# Patient Record
Sex: Male | Born: 2000 | Race: White | Hispanic: No | Marital: Single | Smoking: Never smoker
Health system: Southern US, Community
[De-identification: ages and names within clinical notes are randomized; demographics above are authoritative.]

---
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
## 2022-05-06 ENCOUNTER — Ambulatory Visit
Admission: EM | Admit: 2022-05-06 | Discharge: 2022-05-06 | Disposition: A | Discharge status: Home / Self Care | Payer: Commercial Managed Care - PPO

## 2022-05-06 ENCOUNTER — Encounter: Payer: Self-pay | Admitting: Emergency Medicine

## 2022-05-06 DIAGNOSIS — S0502XA Injury of conjunctiva and corneal abrasion without foreign body, left eye, initial encounter: Secondary | ICD-10-CM

## 2022-05-06 MED ORDER — TOBRAMYCIN-DEXAMETHASONE 0.3-0.1 % OP SUSP: 2.0000 [drp] | OPHTHALMIC | 0 refills | Status: AC

## 2022-05-06 NOTE — ED Triage Notes (Signed)
Something flew in left eye while doing yard work.  States vision is blurry and hurts when eye is closed ?

## 2022-05-06 NOTE — ED Provider Notes (Signed)
?[facility] ? ? ? ?CSN: [id number] ?Arrival date & time: 05/06/22  1233 ? ? ?  ? ?History   ?Chief Complaint ?No chief complaint on file. ? ? ?HPI ?Luke Underwood is a 21 y.o. male.  ? ?The patient is a 21 year old male who presents with left eye pain.  Patient states he was at work this morning when something flew into his left eye.  He thinks it was a piece of wood.  Since that time he has had blurred vision, and pain when he closes his eye.  He also states that he had tearing and drainage when the incident occurred.  Currently, he denies fever, chills, change in vision, loss of vision, drainage, or headache.  States that he has not been seen elsewhere for his injury. ? ?The history is provided by the patient.  ? ?History reviewed. No pertinent past medical history. ? ?There are no problems to display for this patient. ? ? ?History reviewed. No pertinent surgical history. ? ? ? ? ?Home Medications   ? ?Prior to Admission medications   ?Medication Sig Start Date End Date Taking? Authorizing Provider  ?escitalopram (LEXAPRO) 5 MG tablet Take 5 mg by mouth daily.   Yes [provider]  ?tobramycin-dexamethasone Wallene Dales) ophthalmic solution Place 2 drops into the left eye every 4 (four) hours while awake for 7 days. 05/06/22 05/13/22 Yes Cellie Dardis-Warren, Sadie Haber, NP  ?ibuprofen (ADVIL,MOTRIN) 400 MG tablet Take 1 tablet (400 mg total) by mouth every 6 (six) hours as needed for headache. 10/02/14   Mittie Bodo, MD  ? ? ?Family History ?History reviewed. No pertinent family history. ? ?Social History ?Social History  ? ?Tobacco Use  ? Smoking status: Never  ? Smokeless tobacco: Never  ?Vaping Use  ? Vaping Use: Never used  ? ? ? ?Allergies   ?Patient has no known allergies. ? ? ?Review of Systems ?Review of Systems  ?Constitutional: Negative.   ?Eyes:  Positive for pain, discharge and redness. Negative for photophobia and itching.  ?     Blurred vision  ?Respiratory: Negative.     ?Cardiovascular: Negative.   ?Skin: Negative.   ?Psychiatric/Behavioral: Negative.    ? ? ?Physical Exam ?Triage Vital Signs ?ED Triage Vitals  ?Enc Vitals Group  ?   BP 05/06/22 1317 (!) 146/76  ?   Pulse Rate 05/06/22 1317 66  ?   Resp 05/06/22 1317 18  ?   Temp 05/06/22 1317 98.2 ?F (36.8 ?C)  ?   Temp Source 05/06/22 1317 Oral  ?   SpO2 05/06/22 1317 97 %  ?   Weight --   ?   Height --   ?   Head Circumference --   ?   Peak Flow --   ?   Pain Score 05/06/22 1319 0  ?   Pain Loc --   ?   Pain Edu? --   ?   Excl. in [place]? --   ? ?No data found. ? ?Updated Vital Signs ?BP (!) 146/76 (BP Location: Right Arm)   Pulse 66   Temp 98.2 ?F (36.8 ?C) (Oral)   Resp 18   SpO2 97%  ? ?Visual Acuity ?Right Eye Distance:   ?Left Eye Distance:   ?Bilateral Distance:   ? ?Right Eye Near:   ?Left Eye Near:    ?Bilateral Near:    ? ?Physical Exam ?Vitals and nursing note reviewed.  ?Constitutional:   ?   Appearance: Normal appearance.  ?HENT:  ?  Head: Normocephalic.  ?Eyes:  ?   General: Lids are normal. Lids are everted, no foreign bodies appreciated. Vision grossly intact. Gaze aligned appropriately.     ?   Left eye: Discharge (tearing) present.No foreign body or hordeolum.  ?   Extraocular Movements: Extraocular movements intact.  ?   Left eye: Normal extraocular motion and no nystagmus.  ?   Conjunctiva/sclera: Conjunctivae normal.  ?   Left eye: Left conjunctiva is not injected. No chemosis. ?   Pupils: Pupils are equal, round, and reactive to light.  ?   Comments: Fluorescein stain done of the left eye.  Uptake was noted at 3 o'clock.   ?Pulmonary:  ?   Effort: Pulmonary effort is normal.  ?Musculoskeletal:  ?   Cervical back: Normal range of motion.  ?Skin: ?   General: Skin is warm and dry.  ?Neurological:  ?   General: No focal deficit present.  ?   Mental Status: He is alert and oriented to person, place, and time.  ?Psychiatric:     ?   Mood and Affect: Mood normal.     ?   Behavior: Behavior normal.  ? ? ? ?UC  Treatments / Results  ?Labs ?(all labs ordered are listed, but only abnormal results are displayed) ?Labs Reviewed - No data to display ? ?EKG ? ? ?Radiology ?No results found. ? ?Procedures ?Procedures (including critical care time) ? ?Medications Ordered in UC ?Medications - No data to display ? ?Initial Impression / Assessment and Plan / UC Course  ?I have reviewed the triage vital signs and the nursing notes. ? ?Pertinent labs & imaging results that were available during my care of the patient were reviewed by me and considered in my medical decision making (see chart for details). ? ?The patient is a 21 year old male who presents after a foreign body flew into his left eye.  On exam, the patient has erythema, mild tearing to the left eye.  Fluorescein stain was done, uptake was seen at 3:00 which is consistent with a left corneal abrasion.  Patient was started on TobraDex drops.  Patient was given instructions to apply cool compresses for pain or swelling, avoiding rubbing or manipulating the eye.  Patient was given strict precautions of when to go to the ER.  Patient also advised to follow-up with his employer if he needs further evaluation if his symptoms do not improve. ?Final Clinical Impressions(s) / UC Diagnoses  ? ?Final diagnoses:  ?Abrasion of left cornea, initial encounter  ? ? ? ?Discharge Instructions   ? ?  ?Use medication as prescribed. ?Avoid rubbing or irritating the left eye while symptoms persist. ?Cool compresses to the eye for pain, swelling, or redness. ?Follow-up in the emergency department immediately if you develop sudden loss of vision, change in vision, or other concerns. ?Follow-up as needed. ? ? ? ? ?ED Prescriptions   ? ? Medication Sig Dispense Auth. Provider  ? tobramycin-dexamethasone Wallene Dales) ophthalmic solution Place 2 drops into the left eye every 4 (four) hours while awake for 7 days. 5 mL Jeselle Hiser-Warren, Sadie Haber, NP  ? ?  ? ?PDMP not reviewed this encounter. ?   ?Abran Cantor, NP ?05/06/22 1413 ? ?

## 2022-05-06 NOTE — Discharge Instructions (Signed)
Use medication as prescribed. ?Avoid rubbing or irritating the left eye while symptoms persist. ?Cool compresses to the eye for pain, swelling, or redness. ?Follow-up in the emergency department immediately if you develop sudden loss of vision, change in vision, or other concerns. ?Follow-up as needed. ?

## 2022-12-30 ENCOUNTER — Ambulatory Visit
Admission: EM | Admit: 2022-12-30 | Discharge: 2022-12-30 | Disposition: A | Discharge status: Home / Self Care | Payer: Commercial Managed Care - PPO | Attending: Nurse Practitioner | Admitting: Nurse Practitioner

## 2022-12-30 DIAGNOSIS — L739 Follicular disorder, unspecified: Secondary | ICD-10-CM

## 2022-12-30 MED ORDER — MUPIROCIN 2 % EX OINT: 1.0000 | TOPICAL_OINTMENT | Freq: Three times a day (TID) | CUTANEOUS | 0 refills | Status: AC

## 2022-12-30 NOTE — Discharge Instructions (Signed)
I believe your skin is having a reaction to the hot tub you were in over the weekend.  Start applying the antibiotic ointment three times daily to your skin, this should help it to resolve.  Make sure you are keeping your skin clean.  Follow up with [place] or PCP if symptoms persist or worsen despite treatment.

## 2022-12-30 NOTE — ED Triage Notes (Signed)
Pt reports rash in abdomen x 3 days; swollen lymph nodes in axillas x 1 day after being in a hot tub.

## 2022-12-30 NOTE — ED Provider Notes (Signed)
[facility]    CSN: [id number] Arrival date & time: 12/30/22  1405      History   Chief Complaint Chief Complaint  Patient presents with   Rash    HPI Luke Underwood is a 22 y.o. male.   Patient presents today for a few day rash to his lower abdomen, arms, and groin area.  Reports the rash began [place]  Reports that the rash was itchy, however is not itchy anymore.  It is a little bit red.  Does not burn and is not painful.  No oozing, scaling, or blisters.  No fevers or nausea/vomiting.  Reports he does feel some swelling in his groin and in his armpits.  Has not tried anything for rash so far.  No shortness of breath, throat or tongue swelling, or new muscle pain/joint aches.  Reports it has improved slightly from when it initially started.     History reviewed. No pertinent past medical history.  There are no problems to display for this patient.   History reviewed. No pertinent surgical history.     Home Medications    Prior to Admission medications   Medication Sig Start Date End Date Taking? Authorizing Provider  mupirocin ointment (BACTROBAN) 2 % Apply 1 Application topically 3 (three) times daily. Apply to clean skin three times daily until resolution 12/30/22  Yes Noemi Chapel A, NP  escitalopram (LEXAPRO) 5 MG tablet Take 5 mg by mouth daily.    [provider]  ibuprofen (ADVIL,MOTRIN) 400 MG tablet Take 1 tablet (400 mg total) by mouth every 6 (six) hours as needed for headache. 10/02/14   Janell Quiet, MD    Family History History reviewed. No pertinent family history.  Social History Social History   Tobacco Use   Smoking status: Never   Smokeless tobacco: Never  Vaping Use   Vaping Use: Never used  Substance Use Topics   Alcohol use: Never   Drug use: Never     Allergies   Patient has no known allergies.   Review of Systems Review of Systems Per  HPI  Physical Exam Triage Vital Signs ED Triage Vitals  Enc Vitals Group     BP 12/30/22 1510 (!) 160/76     Pulse Rate 12/30/22 1510 73     Resp 12/30/22 1510 15     Temp 12/30/22 1510 98.3 F (36.8 C)     Temp Source 12/30/22 1510 Oral     SpO2 12/30/22 1510 98 %     Weight --      Height --      Head Circumference --      Peak Flow --      Pain Score 12/30/22 1513 0     Pain Loc --      Pain Edu? --      Excl. in [place]? --    No data found.  Updated Vital Signs BP (!) 160/76 (BP Location: Right Arm)   Pulse 73   Temp 98.3 F (36.8 C) (Oral)   Resp 15   SpO2 98%   Visual Acuity Right Eye Distance:   Left Eye Distance:   Bilateral Distance:    Right Eye Near:   Left Eye Near:    Bilateral Near:     Physical Exam Vitals and nursing note reviewed.  Constitutional:      General: He is not in acute distress.  Appearance: Normal appearance. He is not toxic-appearing.  HENT:     Mouth/Throat:     Mouth: Mucous membranes are moist.     Pharynx: Oropharynx is clear.  Pulmonary:     Effort: Pulmonary effort is normal. No respiratory distress.  Lymphadenopathy:     Lower Body: Right inguinal adenopathy present. No left inguinal adenopathy.  Skin:    General: Skin is warm and dry.     Coloration: Skin is not jaundiced or pale.     Findings: Rash present. No erythema. Rash is papular and pustular.     Comments: Discrete, erythematous papular/pustular lesions to upper arms bilaterally.  Lesions noted to lower abdomen and right groin, bilateral underarms.  No active drainage, oozing, tenderness to palpation, or fluctuance.  Neurological:     Mental Status: He is alert and oriented to person, place, and time.      UC Treatments / Results  Labs (all labs ordered are listed, but only abnormal results are displayed) Labs Reviewed - No data to display  EKG   Radiology No results found.  Procedures Procedures (including critical care time)  Medications  Ordered in UC Medications - No data to display  Initial Impression / Assessment and Plan / UC Course  I have reviewed the triage vital signs and the nursing notes.  Pertinent labs & imaging results that were available during my care of the patient were reviewed by me and considered in my medical decision making (see chart for details).   Patient is well-appearing, afebrile, not tachycardic, not tachypneic, oxygenating well on room air.  He is hypertensive today, likely secondary to acute illness.  Folliculitis Treat with topical mupirocin 3 times daily Continue to clean skin twice daily Follow-up if symptoms persist or worsen spite treatment  The patient was given the opportunity to ask questions.  All questions answered to their satisfaction.  The patient is in agreement to this plan.    Final Clinical Impressions(s) / UC Diagnoses   Final diagnoses:  Folliculitis     Discharge Instructions      I believe your skin is having a reaction to the hot tub you were in over the weekend.  Start applying the antibiotic ointment three times daily to your skin, this should help it to resolve.  Make sure you are keeping your skin clean.  Follow up with [place] or PCP if symptoms persist or worsen despite treatment.    ED Prescriptions     Medication Sig Dispense Auth. Provider   mupirocin ointment (BACTROBAN) 2 % Apply 1 Application topically 3 (three) times daily. Apply to clean skin three times daily until resolution 22 g Eulogio Bear, NP      PDMP not reviewed this encounter.   Eulogio Bear, NP 12/30/22 1549

## 2023-01-28 ENCOUNTER — Ambulatory Visit
Admission: EM | Admit: 2023-01-28 | Discharge: 2023-01-28 | Disposition: A | Discharge status: Home / Self Care | Payer: Commercial Managed Care - PPO | Attending: Nurse Practitioner | Admitting: Nurse Practitioner

## 2023-01-28 ENCOUNTER — Encounter: Payer: Self-pay | Admitting: Emergency Medicine

## 2023-01-28 DIAGNOSIS — J069 Acute upper respiratory infection, unspecified: Secondary | ICD-10-CM | POA: Diagnosis present

## 2023-01-28 LAB — POCT RAPID STREP A (OFFICE): Rapid Strep A Screen: NEGATIVE

## 2023-01-28 MED ORDER — CETIRIZINE HCL 10 MG PO TABS: 10.0000 mg | ORAL_TABLET | Freq: Every day | ORAL | 0 refills | Status: AC

## 2023-01-28 MED ORDER — FLUTICASONE PROPIONATE 50 MCG/ACT NA SUSP: 2.0000 | Freq: Every day | NASAL | 0 refills | Status: AC

## 2023-01-28 MED ORDER — PSEUDOEPH-BROMPHEN-DM 30-2-10 MG/5ML PO SYRP: 5.0000 mL | ORAL_SOLUTION | Freq: Four times a day (QID) | ORAL | 0 refills | Status: AC | PRN

## 2023-01-28 NOTE — Discharge Instructions (Addendum)
The rapid strep test was negative, a throat culture is pending.  You will be contacted if the pending test result is positive. Take medication as directed. Increase fluids and get plenty of rest. May take over-the-counter ibuprofen or Tylenol as needed for pain, fever, or general discomfort. Recommend normal saline nasal spray to help with nasal congestion throughout the day. For your cough, it may be helpful to use a humidifier at bedtime during sleep. If symptoms do not improve with this treatment, please follow-up in the clinic within the next 7 to 10 days for reevaluation. Follow-up as needed.

## 2023-01-28 NOTE — ED Triage Notes (Signed)
Sore throat and nasal congestion since Tuesday.

## 2023-01-28 NOTE — ED Provider Notes (Signed)
[facility]    CSN: [id number] Arrival date & time: 01/28/23  1547      History   Chief Complaint No chief complaint on file.   HPI Luke Underwood is a 22 y.o. male.   The history is provided by the patient.   It is with a 2-day history of sore throat, nasal congestion, runny nose and cough.  Patient denies fever, chills, ear pain, wheezing, shortness of breath, difficulty breathing, or GI symptoms.  Patient states he has been taking over-the-counter medications for his symptoms with minimal relief.  Denies history of seasonal allergies.  He denies any obvious known sick contacts.  Patient reports that he did take a home COVID test which was negative. History reviewed. No pertinent past medical history.  There are no problems to display for this patient.   History reviewed. No pertinent surgical history.     Home Medications    Prior to Admission medications   Medication Sig Start Date End Date Taking? Authorizing Provider  brompheniramine-pseudoephedrine-DM 30-2-10 MG/5ML syrup Take 5 mLs by mouth 4 (four) times daily as needed. 01/28/23  Yes Lawanna Cecere-Warren, Alda Lea, NP  cetirizine (ZYRTEC) 10 MG tablet Take 1 tablet (10 mg total) by mouth daily. 01/28/23  Yes Ishmeal Rorie-Warren, Alda Lea, NP  fluticasone (FLONASE) 50 MCG/ACT nasal spray Place 2 sprays into both nostrils daily. 01/28/23  Yes Shandy Checo-Warren, Alda Lea, NP  escitalopram (LEXAPRO) 5 MG tablet Take 5 mg by mouth daily.    [provider]  ibuprofen (ADVIL,MOTRIN) 400 MG tablet Take 1 tablet (400 mg total) by mouth every 6 (six) hours as needed for headache. 10/02/14   Janell Quiet, MD  mupirocin ointment (BACTROBAN) 2 % Apply 1 Application topically 3 (three) times daily. Apply to clean skin three times daily until resolution 12/30/22   Eulogio Bear, NP    Family History History reviewed. No pertinent family history.  Social History Social History   Tobacco Use   Smoking status:  Never   Smokeless tobacco: Never  Vaping Use   Vaping Use: Never used  Substance Use Topics   Alcohol use: Never   Drug use: Never     Allergies   Patient has no known allergies.   Review of Systems Review of Systems Per HPI  Physical Exam Triage Vital Signs ED Triage Vitals  Enc Vitals Group     BP 01/28/23 1550 133/76     Pulse Rate 01/28/23 1550 76     Resp 01/28/23 1550 18     Temp 01/28/23 1550 98.5 F (36.9 C)     Temp Source 01/28/23 1550 Oral     SpO2 01/28/23 1550 97 %     Weight --      Height --      Head Circumference --      Peak Flow --      Pain Score 01/28/23 1551 5     Pain Loc --      Pain Edu? --      Excl. in [place]? --    No data found.  Updated Vital Signs BP 133/76 (BP Location: Right Arm)   Pulse 76   Temp 98.5 F (36.9 C) (Oral)   Resp 18   SpO2 97%   Visual Acuity Right Eye Distance:   Left Eye Distance:   Bilateral Distance:    Right Eye Near:   Left Eye Near:    Bilateral Near:     Physical Exam Vitals and  nursing note reviewed.  Constitutional:      General: He is not in acute distress.    Appearance: Normal appearance.  HENT:     Head: Normocephalic.     Right Ear: Tympanic membrane, ear canal and external ear normal.     Left Ear: Tympanic membrane, ear canal and external ear normal.     Nose: Congestion and rhinorrhea present.     Right Turbinates: Enlarged and swollen.     Left Turbinates: Enlarged and swollen.     Right Sinus: No maxillary sinus tenderness or frontal sinus tenderness.     Left Sinus: No maxillary sinus tenderness or frontal sinus tenderness.     Mouth/Throat:     Lips: Pink.     Mouth: Mucous membranes are moist.     Pharynx: Uvula midline. Pharyngeal swelling and posterior oropharyngeal erythema present. No oropharyngeal exudate or uvula swelling.     Tonsils: 1+ on the right. 1+ on the left.     Comments: Cobblestoning present on posterior oropharnyx Eyes:     Extraocular Movements:  Extraocular movements intact.     Conjunctiva/sclera: Conjunctivae normal.     Pupils: Pupils are equal, round, and reactive to light.  Cardiovascular:     Rate and Rhythm: Normal rate and regular rhythm.     Pulses: Normal pulses.     Heart sounds: Normal heart sounds.  Pulmonary:     Effort: Pulmonary effort is normal. No respiratory distress.     Breath sounds: Normal breath sounds. No stridor. No wheezing, rhonchi or rales.  Abdominal:     General: Bowel sounds are normal.     Palpations: Abdomen is soft.  Musculoskeletal:     Cervical back: Normal range of motion.  Lymphadenopathy:     Cervical: No cervical adenopathy.  Skin:    General: Skin is warm and dry.  Neurological:     General: No focal deficit present.     Mental Status: He is alert and oriented to person, place, and time.  Psychiatric:        Mood and Affect: Mood normal.        Behavior: Behavior normal.      UC Treatments / Results  Labs (all labs ordered are listed, but only abnormal results are displayed) Labs Reviewed  CULTURE, GROUP A STREP [facility])  POCT RAPID STREP A (OFFICE)    EKG   Radiology No results found.  Procedures Procedures (including critical care time)  Medications Ordered in UC Medications - No data to display  Initial Impression / Assessment and Plan / UC Course  I have reviewed the triage vital signs and the nursing notes.  Pertinent labs & imaging results that were available during my care of the patient were reviewed by me and considered in my medical decision making (see chart for details).  Patient is well-appearing, he is in no acute distress, vital signs are stable.  Rapid strep test was negative, throat culture is pending.  Patient with a negative home COVID test, declines further viral testing.  Differential diagnoses include viral upper respiratory infection with cough, COVID, and allergic rhinitis.  Will treat patient symptomatically with Bromfed-DM cough syrup for  his cough, cetirizine 10 mg for nasal congestion, and fluticasone 50 mcg nasal spray for nasal congestion.  Supportive care recommendations were provided to the patient to include increasing fluids, allowing for plenty of rest, and over-the-counter analgesics for pain or discomfort.  Patient is in agreement with this plan of care,  and verbalizes understanding.  All questions were answered.  Patient stable for discharge.   Final Clinical Impressions(s) / UC Diagnoses   Final diagnoses:  Viral upper respiratory tract infection with cough     Discharge Instructions      The rapid strep test was negative, a throat culture is pending.  You will be contacted if the pending test result is positive. Take medication as directed. Increase fluids and get plenty of rest. May take over-the-counter ibuprofen or Tylenol as needed for pain, fever, or general discomfort. Recommend normal saline nasal spray to help with nasal congestion throughout the day. For your cough, it may be helpful to use a humidifier at bedtime during sleep. If symptoms do not improve with this treatment, please follow-up in the clinic within the next 7 to 10 days for reevaluation. Follow-up as needed.     ED Prescriptions     Medication Sig Dispense Auth. Provider   brompheniramine-pseudoephedrine-DM 30-2-10 MG/5ML syrup Take 5 mLs by mouth 4 (four) times daily as needed. 140 mL Aedin Jeansonne-Warren, Alda Lea, NP   cetirizine (ZYRTEC) 10 MG tablet Take 1 tablet (10 mg total) by mouth daily. 30 tablet Kynlei Piontek-Warren, Alda Lea, NP   fluticasone (FLONASE) 50 MCG/ACT nasal spray Place 2 sprays into both nostrils daily. 16 g Yahel Fuston-Warren, Alda Lea, NP      PDMP not reviewed this encounter.   Tish Men, NP 01/28/23 1625

## 2023-01-30 LAB — CULTURE, GROUP A STREP (THRC)

## 2023-01-31 LAB — CULTURE, GROUP A STREP (THRC)
